# Patient Record
Sex: Male | Born: 2000 | Hispanic: Yes | Marital: Single | Smoking: Never smoker
Health system: Southern US, Community
[De-identification: ages and names within clinical notes are randomized; demographics above are authoritative.]

---
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
## 2019-12-28 IMAGING — CR CHEST - 2 VIEW
1 series · 2 of 2 positions shown · non-contrast
Comparison: None.

CLINICAL DATA: Chest pain

EXAM:
CHEST - 2 VIEW

[Series 1: dg chest 2 view · 0.14mm/px · 2 of 2 slices shown]
[im 1/2]
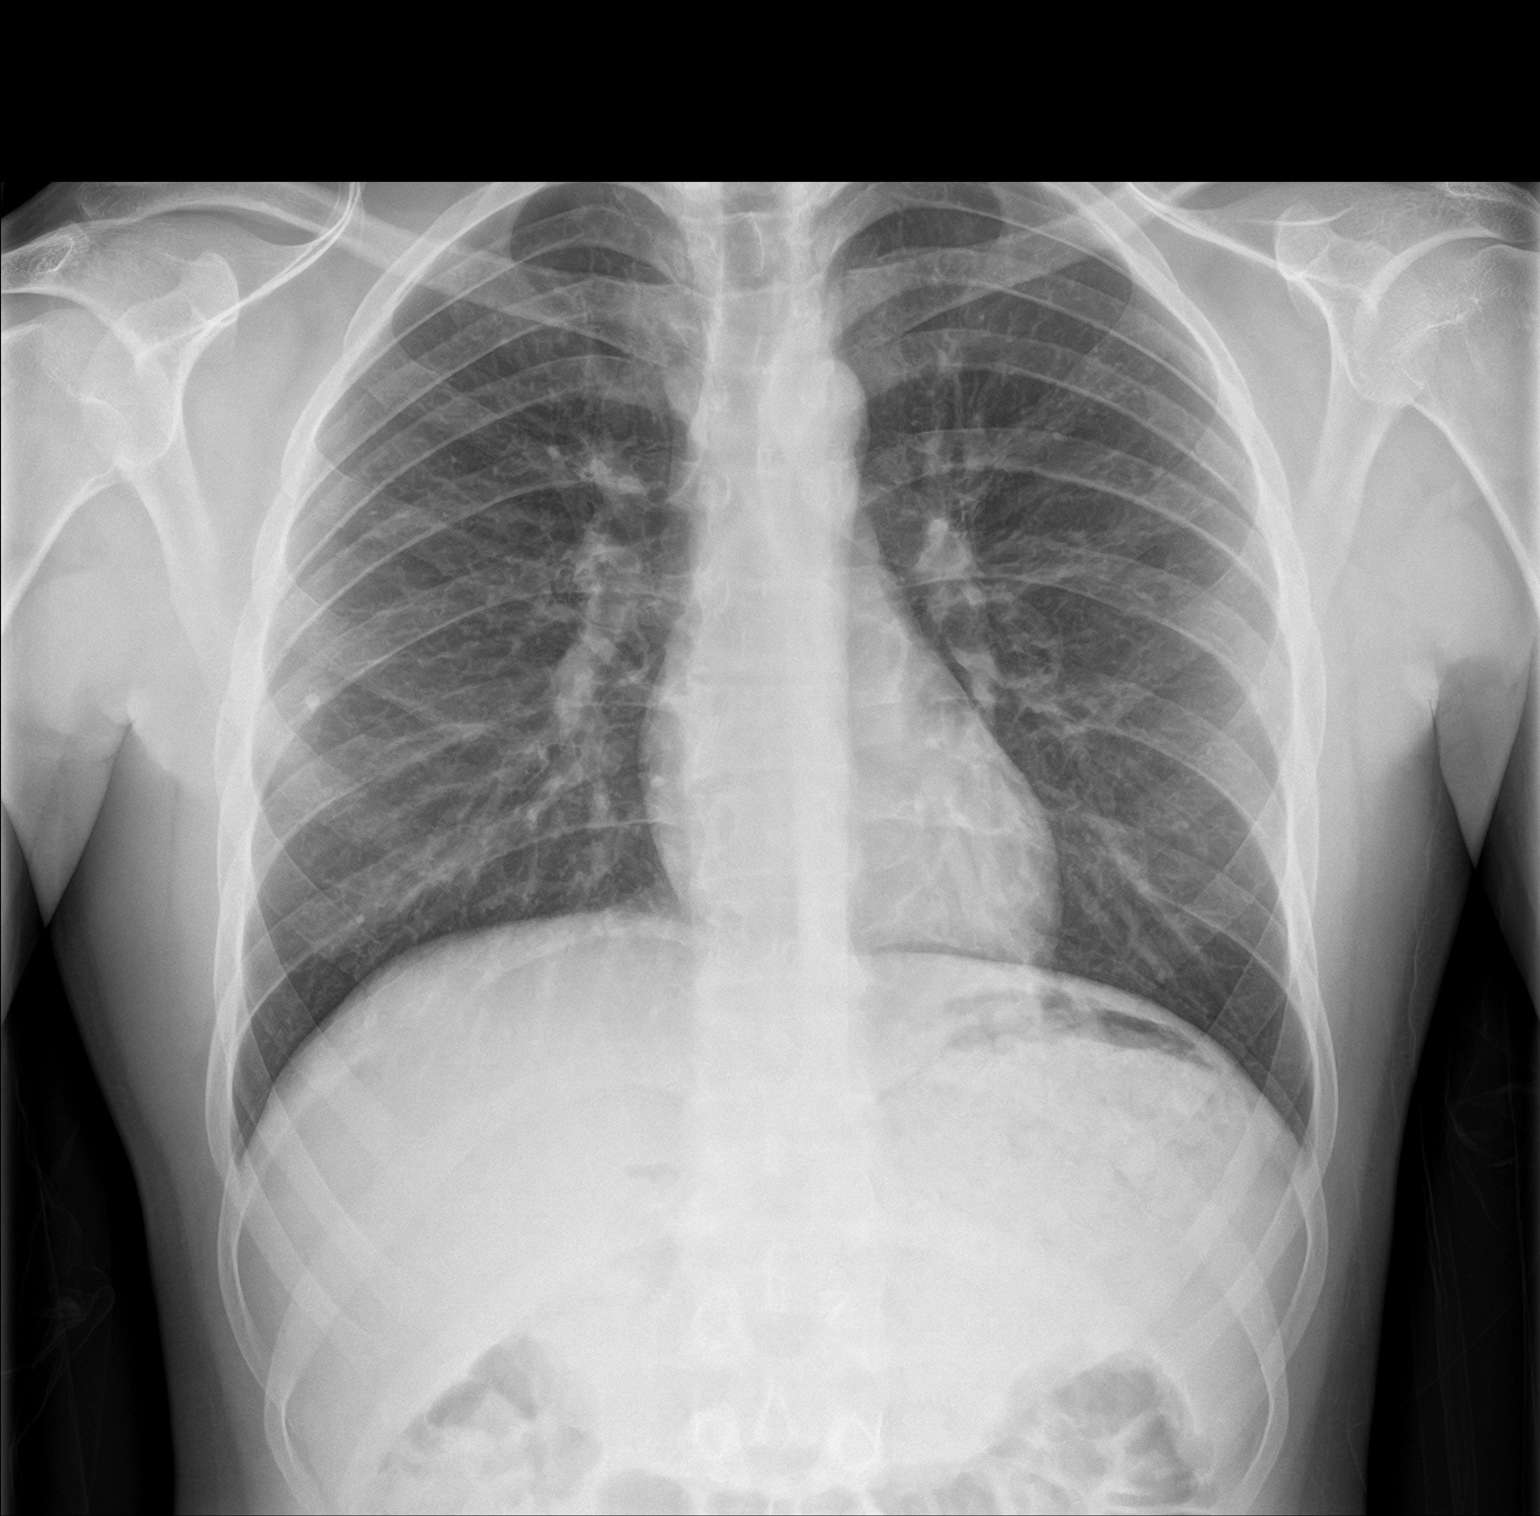
[im 2/2]
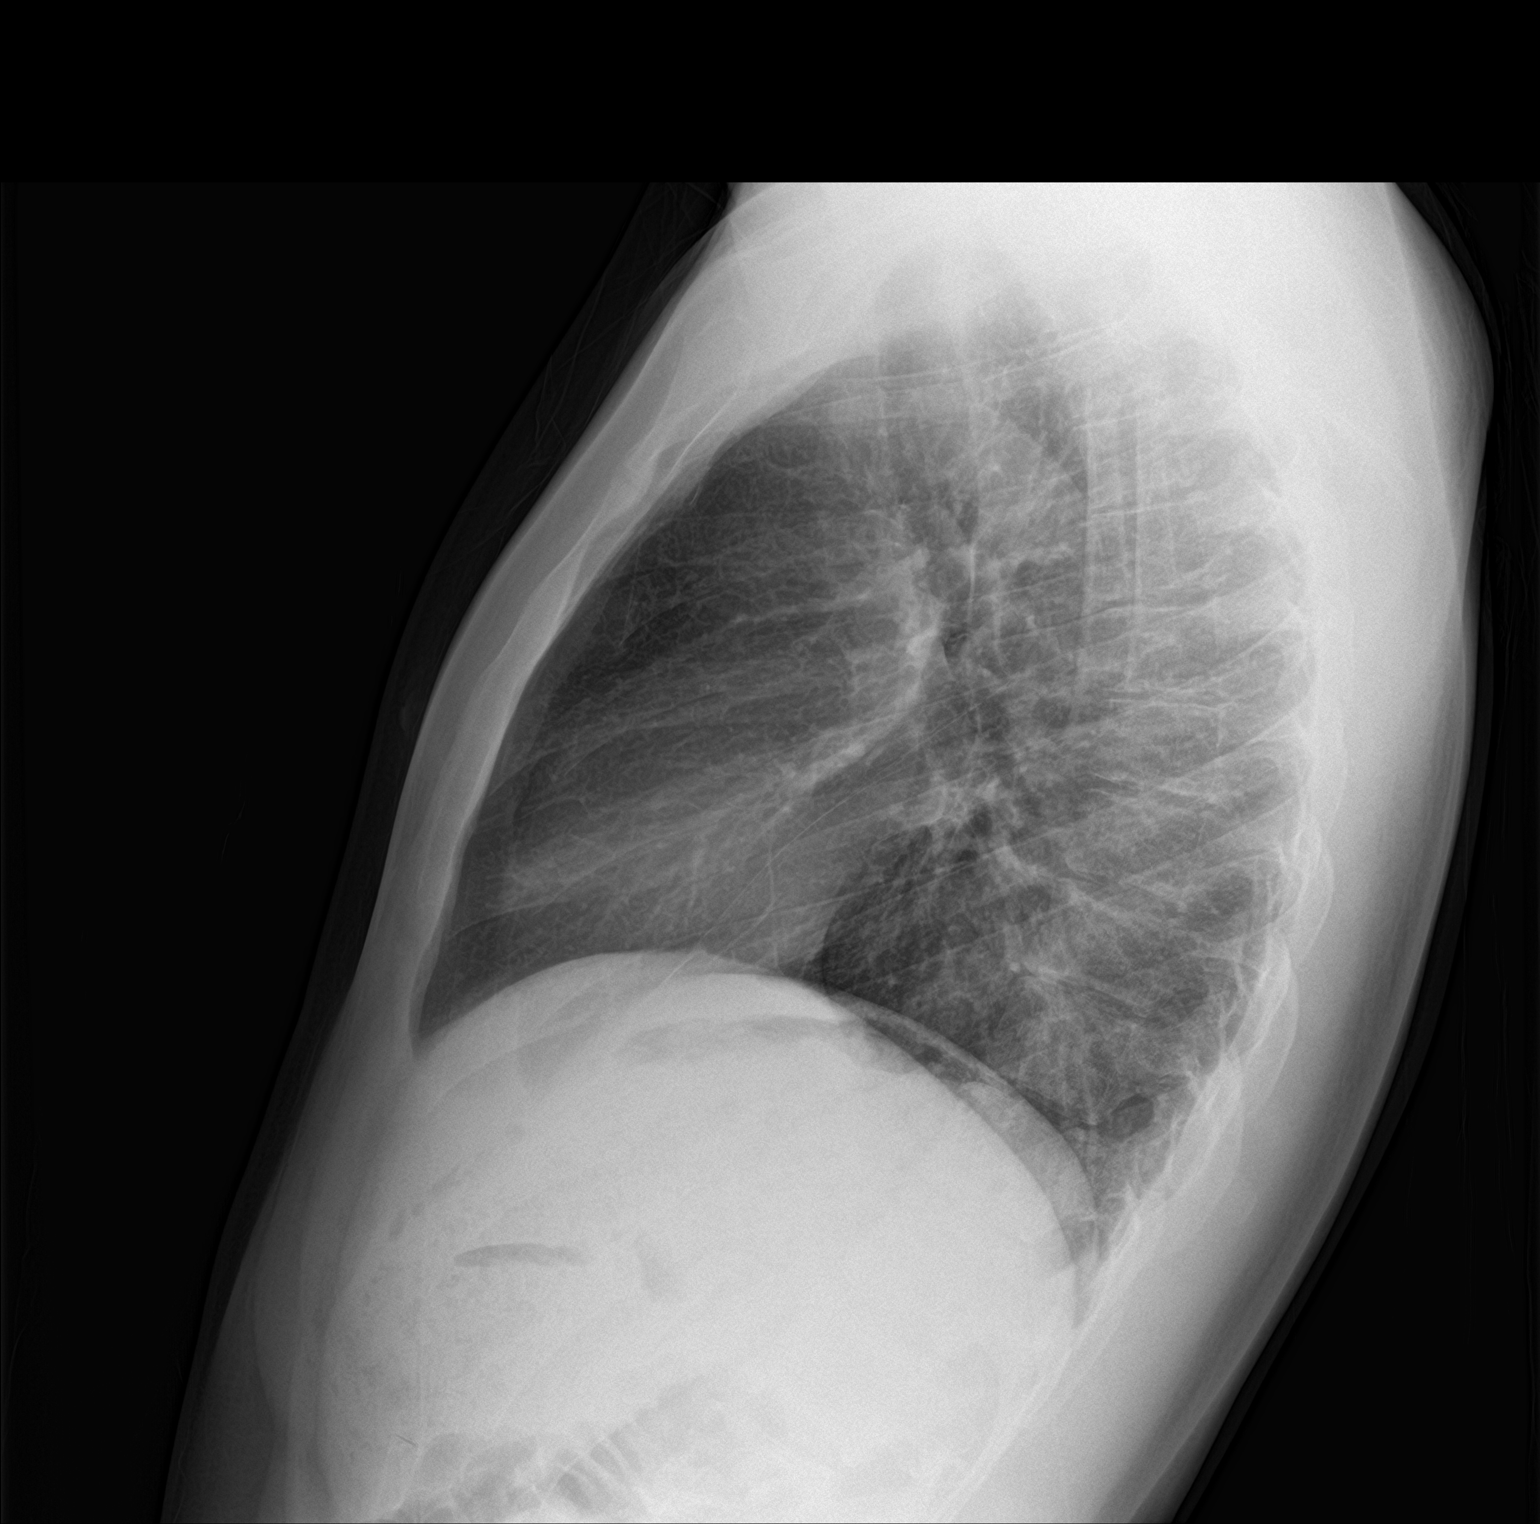

[2 of 2 positions shown; findings below may reference images not displayed]

FINDINGS: The heart size and mediastinal contours are within normal limits.
Both lungs are clear. A calcified granuloma is noted in the
peripheral right mid lung zone. The visualized skeletal structures
are unremarkable.
IMPRESSION: No active cardiopulmonary disease.

## 2020-09-23 ENCOUNTER — Other Ambulatory Visit: Payer: Self-pay | Admitting: Primary Care

## 2020-09-23 ENCOUNTER — Other Ambulatory Visit (HOSPITAL_COMMUNITY): Payer: Self-pay | Admitting: Primary Care

## 2020-09-23 DIAGNOSIS — R1084 Generalized abdominal pain: Secondary | ICD-10-CM

## 2020-09-30 ENCOUNTER — Other Ambulatory Visit: Payer: Self-pay

## 2020-09-30 ENCOUNTER — Ambulatory Visit (HOSPITAL_COMMUNITY)
Admission: RE | Admit: 2020-09-30 | Discharge: 2020-09-30 | Disposition: A | Discharge status: Home / Self Care | Payer: Medicaid Other | Source: Ambulatory Visit | Attending: Primary Care | Admitting: Primary Care

## 2020-09-30 DIAGNOSIS — R1084 Generalized abdominal pain: Secondary | ICD-10-CM | POA: Diagnosis present

## 2024-01-01 ENCOUNTER — Ambulatory Visit

## 2024-01-01 DIAGNOSIS — K2950 Unspecified chronic gastritis without bleeding: Secondary | ICD-10-CM | POA: Diagnosis not present
